# Patient Record
Sex: Female | Born: 1990 | Race: Black or African American | Hispanic: No | Marital: Single | Smoking: Never smoker
Health system: Southern US, Community
[De-identification: ages and names within clinical notes are randomized; demographics above are authoritative.]

## PROBLEM LIST (undated history)

## (undated) DIAGNOSIS — A609 Anogenital herpesviral infection, unspecified: Secondary | ICD-10-CM

---

## 2020-02-08 ENCOUNTER — Other Ambulatory Visit: Payer: Self-pay

## 2020-02-08 ENCOUNTER — Encounter (HOSPITAL_COMMUNITY): Payer: Self-pay | Admitting: Emergency Medicine

## 2020-02-08 ENCOUNTER — Encounter (HOSPITAL_COMMUNITY): Payer: Self-pay

## 2020-02-08 ENCOUNTER — Emergency Department (HOSPITAL_COMMUNITY)
Admission: EM | Admit: 2020-02-08 | Discharge: 2020-02-09 | Disposition: A | Discharge status: Home / Self Care | Payer: Self-pay | Attending: Emergency Medicine | Admitting: Emergency Medicine

## 2020-02-08 ENCOUNTER — Ambulatory Visit (HOSPITAL_COMMUNITY)
Admission: EM | Admit: 2020-02-08 | Discharge: 2020-02-08 | Disposition: A | Discharge status: Home / Self Care | Payer: Self-pay | Attending: Family Medicine | Admitting: Family Medicine

## 2020-02-08 DIAGNOSIS — J039 Acute tonsillitis, unspecified: Secondary | ICD-10-CM | POA: Insufficient documentation

## 2020-02-08 DIAGNOSIS — J36 Peritonsillar abscess: Secondary | ICD-10-CM

## 2020-02-08 HISTORY — DX: Anogenital herpesviral infection, unspecified: A60.9

## 2020-02-08 LAB — BASIC METABOLIC PANEL
Anion gap: 11 (ref 5–15)
BUN: 11 mg/dL (ref 6–20)
CO2: 20 mmol/L — ABNORMAL LOW (ref 22–32)
Calcium: 9.2 mg/dL (ref 8.9–10.3)
Chloride: 105 mmol/L (ref 98–111)
Creatinine, Ser: 0.84 mg/dL (ref 0.44–1.00)
GFR calc Af Amer: >60 mL/min (ref >60)
GFR calc non Af Amer: >60 mL/min (ref >60)
Glucose, Bld: 101 mg/dL — ABNORMAL HIGH (ref 70–99)
Potassium: 2.9 mmol/L — ABNORMAL LOW (ref 3.5–5.1)
Sodium: 136 mmol/L (ref 135–145)

## 2020-02-08 LAB — LACTIC ACID, PLASMA: Lactic Acid, Venous: 1.1 mmol/L (ref 0.5–1.9)

## 2020-02-08 LAB — CBC WITH DIFFERENTIAL/PLATELET
Abs Immature Granulocytes: 0.05 10*3/uL (ref 0.00–0.07)
Basophils Absolute: 0 10*3/uL (ref 0.0–0.1)
Basophils Relative: 0 %
Eosinophils Absolute: 0 10*3/uL (ref 0.0–0.5)
Eosinophils Relative: 0 %
HCT: 36.1 % (ref 36.0–46.0)
Hemoglobin: 11.1 g/dL — ABNORMAL LOW (ref 12.0–15.0)
Immature Granulocytes: 0 %
Lymphocytes Relative: 8 %
Lymphs Abs: 1.2 10*3/uL (ref 0.7–4.0)
MCH: 22.8 pg — ABNORMAL LOW (ref 26.0–34.0)
MCHC: 30.7 g/dL (ref 30.0–36.0)
MCV: 74.1 fL — ABNORMAL LOW (ref 80.0–100.0)
Monocytes Absolute: 1.2 10*3/uL — ABNORMAL HIGH (ref 0.1–1.0)
Monocytes Relative: 8 %
Neutro Abs: 11.8 10*3/uL — ABNORMAL HIGH (ref 1.7–7.7)
Neutrophils Relative %: 84 %
Platelets: 210 10*3/uL (ref 150–400)
RBC: 4.87 MIL/uL (ref 3.87–5.11)
RDW: 15.8 % — ABNORMAL HIGH (ref 11.5–15.5)
WBC: 14.3 10*3/uL — ABNORMAL HIGH (ref 4.0–10.5)
nRBC: 0 % (ref 0.0–0.2)

## 2020-02-08 LAB — GROUP A STREP BY PCR: Group A Strep by PCR: NOT DETECTED

## 2020-02-08 LAB — POCT RAPID STREP A, ED / UC: Streptococcus, Group A Screen (Direct): NEGATIVE

## 2020-02-08 MED ORDER — PREDNISONE 50 MG PO TABS
50.0000 mg | ORAL_TABLET | Freq: Every day | ORAL | 0 refills | Status: AC
Start: 2020-02-08 — End: 2020-02-12

## 2020-02-08 MED ORDER — POTASSIUM CHLORIDE 10 MEQ/100ML IV SOLN
10.0000 meq | INTRAVENOUS | Status: AC
  Administered 2020-02-08 (×2): 10 meq via INTRAVENOUS
  Filled 2020-02-08 (×2): qty 100

## 2020-02-08 MED ORDER — DEXAMETHASONE SODIUM PHOSPHATE 10 MG/ML IJ SOLN
10.0000 mg | Freq: Once | INTRAMUSCULAR | Status: AC
  Administered 2020-02-08: 10 mg via INTRAVENOUS
  Filled 2020-02-08: qty 1

## 2020-02-08 MED ORDER — SODIUM CHLORIDE 0.9 % IV SOLN
2.0000 g | Freq: Once | INTRAVENOUS | Status: AC
  Administered 2020-02-08: 2 g via INTRAVENOUS
  Filled 2020-02-08: qty 20

## 2020-02-08 MED ORDER — CLINDAMYCIN HCL 150 MG PO CAPS
450.0000 mg | ORAL_CAPSULE | Freq: Three times a day (TID) | ORAL | 0 refills | Status: AC
Start: 2020-02-08 — End: 2020-02-15

## 2020-02-08 MED ORDER — SODIUM CHLORIDE 0.9 % IV SOLN: Freq: Once | INTRAVENOUS | Status: AC

## 2020-02-08 NOTE — ED Provider Notes (Signed)
[facility]    CSN: [id number] Arrival date & time: 02/08/20  5361      History   Chief Complaint No chief complaint on file.   HPI Carrie Strickland is a 29 y.o. female.   Patient is a 29 year old female that presents today with sore throat, trouble swallowing, pain with swallowing.  Took ibuprofen this morning had trouble swallowing the ibuprofen.  Reporting even having trouble swallowing liquids.  Low-grade fever here today.  Mild headache and chills.  Remote history of strep throat years ago.     Past Medical History:  Diagnosis Date  . HSV (herpes simplex virus) anogenital infection     There are no problems to display for this patient.   History reviewed. No pertinent surgical history.  OB History   No obstetric history on file.      Home Medications    Prior to Admission medications   Not on File    Family History Family History  Problem Relation Age of Onset  . Healthy Mother   . Hypertension Father     Social History Social History   Tobacco Use  . Smoking status: Never Smoker  . Smokeless tobacco: Never Used  Vaping Use  . Vaping Use: Never used  Substance Use Topics  . Alcohol use: Yes    Comment: Occasional  . Drug use: Yes    Types: Marijuana    Comment: Occasional     Allergies   Patient has no allergy information on record.   Review of Systems Review of Systems   Physical Exam Triage Vital Signs ED Triage Vitals  Enc Vitals Group     BP 02/08/20 0844 123/73     Pulse Rate 02/08/20 0844 84     Resp 02/08/20 0844 18     Temp 02/08/20 0844 99.8 F (37.7 C)     Temp Source 02/08/20 0844 Oral     SpO2 02/08/20 0844 98 %     Weight --      Height --      Head Circumference --      Peak Flow --      Pain Score 02/08/20 0840 8     Pain Loc --      Pain Edu? --      Excl. in GC? --    No data found.  Updated Vital Signs BP 123/73 (BP Location: Left Arm)   Pulse 84   Temp 99.8 F (37.7 C) (Oral)   Resp  18   LMP 01/11/2020   SpO2 98%   Visual Acuity Right Eye Distance:   Left Eye Distance:   Bilateral Distance:    Right Eye Near:   Left Eye Near:    Bilateral Near:     Physical Exam Vitals and nursing note reviewed.  Constitutional:      General: She is not in acute distress.    Appearance: Normal appearance. She is not ill-appearing, toxic-appearing or diaphoretic.  HENT:     Head: Normocephalic.     Nose: Nose normal.     Mouth/Throat:     Pharynx: Uvula midline. Pharyngeal swelling, oropharyngeal exudate and posterior oropharyngeal erythema present.     Tonsils: Tonsillar abscess present. 4+ on the right. 4+ on the left.  Eyes:     Conjunctiva/sclera: Conjunctivae normal.  Pulmonary:     Effort: Pulmonary effort is normal.  Musculoskeletal:        General: Normal range of motion.     Cervical  back: Normal range of motion.  Skin:    General: Skin is warm and dry.     Findings: No rash.  Neurological:     Mental Status: She is alert.  Psychiatric:        Mood and Affect: Mood normal.      UC Treatments / Results  Labs (all labs ordered are listed, but only abnormal results are displayed) Labs Reviewed  CULTURE, GROUP A STREP [facility])  POCT RAPID STREP A, ED / UC    EKG   Radiology No results found.  Procedures Procedures (including critical care time)  Medications Ordered in UC Medications - No data to display  Initial Impression / Assessment and Plan / UC Course  I have reviewed the triage vital signs and the nursing notes.  Pertinent labs & imaging results that were available during my care of the patient were reviewed by me and considered in my medical decision making (see chart for details).     Peritonsillar abscess very large in size.  Patient having trouble opening mouth and unable to swallow medication.  We will send to the ER for IV antibiotics and possible ENT consult. Rapid strep test negative. Final Clinical Impressions(s) / UC  Diagnoses   Final diagnoses:  Abscess, peritonsillar     Discharge Instructions     Please go to the ER for further management of this peritonsillar abscess.     ED Prescriptions    None     PDMP not reviewed this encounter.   Janace Aris, NP 02/08/20 [phone]

## 2020-02-08 NOTE — Discharge Instructions (Addendum)
Please go to the ER for further management of this peritonsillar abscess.

## 2020-02-08 NOTE — ED Provider Notes (Addendum)
[facility] EMERGENCY DEPARTMENT Provider Note   CSN: [id number] Arrival date & time: 02/08/20  0935     History No chief complaint on file.   Carrie Strickland is a 29 y.o. female with no relevant past medical history presents the ED with a 2-day history of progressively worsening sore throat.  Patient went to an urgent care this morning and was subsequently referred here to the ED given concern for PTA.  On my examination, patient states that she has been having fevers and chills at home.  She also endorses voice change and "difficulty talking".  She is also experiencing difficulty swallowing and tolerating secretions.  She cannot open her mouth wide on my examination.  She denies any congestion, cough, headache or dizziness, or other symptoms.    She has not received her COVID-19 vaccination.  HPI     Past Medical History:  Diagnosis Date  . HSV (herpes simplex virus) anogenital infection     There are no problems to display for this patient.   History reviewed. No pertinent surgical history.   OB History   No obstetric history on file.     Family History  Problem Relation Age of Onset  . Healthy Mother   . Hypertension Father     Social History   Tobacco Use  . Smoking status: Never Smoker  . Smokeless tobacco: Never Used  Vaping Use  . Vaping Use: Never used  Substance Use Topics  . Alcohol use: Yes    Comment: Occasional  . Drug use: Yes    Types: Marijuana    Comment: Occasional    Home Medications Prior to Admission medications   Medication Sig Start Date End Date Taking? Authorizing Provider  clindamycin (CLEOCIN) 150 MG capsule Take 3 capsules (450 mg total) by mouth 3 (three) times daily for 7 days. 02/08/20 02/15/20  Lorelee New, PA-C  predniSONE (DELTASONE) 50 MG tablet Take 1 tablet (50 mg total) by mouth daily with breakfast for 4 days. 02/08/20 02/12/20  Lorelee New, PA-C    Allergies    Patient has no known  allergies.  Review of Systems   Review of Systems  All other systems reviewed and are negative.   Physical Exam Updated Vital Signs BP 123/73   Pulse 93   Temp 99.7 F (37.6 C) (Oral)   Resp 18   LMP 01/11/2020   SpO2 100%   Physical Exam Vitals and nursing note reviewed. Exam conducted with a chaperone present.  Constitutional:      General: She is not in acute distress.    Appearance: She is ill-appearing.  HENT:     Head: Normocephalic and atraumatic.     Mouth/Throat:     Comments: Patent oropharynx.  Uvula relatively midline.  Tonsillar hypertrophy with exudates noted bilaterally.  Left tonsil appears to be larger, concern for abscess.  No other masses appreciated.  No tongue swelling or floor of mouth induration.  2 finger trismus.  Tolerating secretions.  Hot potato voice noted. Eyes:     General: No scleral icterus.    Conjunctiva/sclera: Conjunctivae normal.  Neck:     Comments: No meningismus. Cardiovascular:     Rate and Rhythm: Regular rhythm. Tachycardia present.     Pulses: Normal pulses.     Heart sounds: Normal heart sounds.  Pulmonary:     Effort: Pulmonary effort is normal. No respiratory distress.     Breath sounds: Normal breath sounds.     Comments:  No wheezing or stridor appreciated. Musculoskeletal:     Cervical back: Normal range of motion and neck supple. No rigidity.  Skin:    General: Skin is dry.  Neurological:     Mental Status: She is alert.     GCS: GCS eye subscore is 4. GCS verbal subscore is 5. GCS motor subscore is 6.  Psychiatric:        Mood and Affect: Mood normal.        Behavior: Behavior normal.        Thought Content: Thought content normal.     ED Results / Procedures / Treatments   Labs (all labs ordered are listed, but only abnormal results are displayed) Labs Reviewed  CBC WITH DIFFERENTIAL/PLATELET - Abnormal; Notable for the following components:      Result Value   WBC 14.3 (*)    Hemoglobin 11.1 (*)    MCV  74.1 (*)    MCH 22.8 (*)    RDW 15.8 (*)    Neutro Abs 11.8 (*)    Monocytes Absolute 1.2 (*)    All other components within normal limits  BASIC METABOLIC PANEL - Abnormal; Notable for the following components:   Potassium 2.9 (*)    CO2 20 (*)    Glucose, Bld 101 (*)    All other components within normal limits  GROUP A STREP BY PCR  LACTIC ACID, PLASMA  LACTIC ACID, PLASMA    EKG None  Radiology No results found.  Procedures Procedures (including critical care time)  Medications Ordered in ED Medications  potassium chloride 10 mEq in 100 mL IVPB (10 mEq Intravenous New Bag/Given 02/08/20 1517)  dexamethasone (DECADRON) injection 10 mg (10 mg Intravenous Given 02/08/20 1354)  cefTRIAXone (ROCEPHIN) 2 g in sodium chloride 0.9 % 100 mL IVPB (0 g Intravenous Stopped 02/08/20 1427)  0.9 %  sodium chloride infusion ( Intravenous New Bag/Given 02/08/20 1353)    ED Course  I have reviewed the triage vital signs and the nursing notes.  Pertinent labs & imaging results that were available during my care of the patient were reviewed by me and considered in my medical decision making (see chart for details).  Clinical Course as of Feb 08 1619  Thu Feb 08, 2020  1458 Discussed with Dr. Pollyann Kennedy, ENT, who recommends that patient be discharged home on clindamycin with instruction to call the office to schedule appointment for close follow-up.  He is reassured given that she can still tolerate full liquid by mouth and has a patent oropharynx.  Given that her sore throat symptoms just began a couple days ago, this would be an unusually quick progression.     [GG]    Clinical Course User Index [GG] Lorelee New, PA-C   MDM Rules/Calculators/A&P                          Patient presents to the ED with evidence of tonsillitis and exudates noted.  The uvula is relatively midline, however there does appear to be a larger mass on the left side concerning for PTA.    Discussed with Dr.  Pollyann Kennedy, ENT, who recommends that patient be discharged home on clindamycin with instruction to call the office to schedule appointment for close follow-up.  He is reassured given that she can still tolerate full liquid by mouth and has a patent oropharynx.  Given that her sore throat symptoms just began a couple days ago, this would  be an unusually quick progression.  No CT imaging warranted.  He recommends discharge home with course of antibiotics and emphasis on drinking plenty of fluids.  On subsequent evaluation after patient was given Decadron, she is feeling much improved.  Strict ED return precautions discussed with patient.  All of the evaluation and work-up results were discussed with the patient and any family at bedside.  Patient and/or family were informed that while patient is appropriate for discharge at this time, some medical emergencies may only develop or become detectable after a period of time.  I specifically instructed patient and/or family to return to return to the ED or seek immediate medical attention for any new or worsening symptoms.  They were provided opportunity to ask any additional questions and have none at this time.  Prior to discharge patient is feeling well, agreeable with plan for discharge home.  They have expressed understanding of verbal discharge instructions as well as return precautions and are agreeable to the plan.    Final Clinical Impression(s) / ED Diagnoses Final diagnoses:  Tonsillitis    Rx / DC Orders ED Discharge Orders         Ordered    clindamycin (CLEOCIN) 150 MG capsule  3 times daily     Discontinue  Reprint     02/08/20 1615    predniSONE (DELTASONE) 50 MG tablet  Daily with breakfast     Discontinue  Reprint     02/08/20 1615           Lorelee New, PA-C 02/08/20 1626    Lorelee New, PA-C 02/08/20 1626    Margarita Grizzle, MD 02/09/20 1330

## 2020-02-08 NOTE — ED Triage Notes (Signed)
Pt presents with sore throat, chills, headache, ear ache, 1 episode of diarrhea xs 2 days.  Denies N, V, abdominal pain, loss taste and smell.   States took ibuprofen at 4 am, with minimal relief.

## 2020-02-08 NOTE — ED Notes (Signed)
Patient is being discharged from the Urgent Care and sent to the Emergency Department via personal vehicle . Per provider Dahlia Byes, patient is in need of higher level of care due to peritonsillar abscess. Patient is aware and verbalizes understanding of plan of care.   Vitals:   02/08/20 0844  BP: 123/73  Pulse: 84  Resp: 18  Temp: 99.8 F (37.7 C)  SpO2: 98%

## 2020-02-08 NOTE — Discharge Instructions (Signed)
Please take your medications, as directed.  You will need to follow-up with ENT regarding your tonsillitis.  Suspect that this could be viral in etiology, but recommending that you take these antibiotics as directed.  Return to the ED or seek immediate medical attention should you experience any new or worsening symptoms.

## 2020-02-08 NOTE — ED Triage Notes (Signed)
Patient from [facility] to be evaluated for tonsillar abscess, pain x 2 days, no trouble handling secretions

## 2020-02-10 LAB — CULTURE, GROUP A STREP (THRC)

## 2021-08-21 ENCOUNTER — Encounter (HOSPITAL_BASED_OUTPATIENT_CLINIC_OR_DEPARTMENT_OTHER): Payer: Self-pay

## 2021-08-21 ENCOUNTER — Other Ambulatory Visit: Payer: Self-pay

## 2021-08-21 ENCOUNTER — Emergency Department (HOSPITAL_BASED_OUTPATIENT_CLINIC_OR_DEPARTMENT_OTHER): Payer: 59

## 2021-08-21 ENCOUNTER — Emergency Department (HOSPITAL_BASED_OUTPATIENT_CLINIC_OR_DEPARTMENT_OTHER)
Admission: EM | Admit: 2021-08-21 | Discharge: 2021-08-21 | Disposition: A | Discharge status: Home / Self Care | Payer: 59 | Attending: Emergency Medicine | Admitting: Emergency Medicine

## 2021-08-21 DIAGNOSIS — S29001A Unspecified injury of muscle and tendon of front wall of thorax, initial encounter: Secondary | ICD-10-CM | POA: Insufficient documentation

## 2021-08-21 DIAGNOSIS — S301XXA Contusion of abdominal wall, initial encounter: Secondary | ICD-10-CM | POA: Insufficient documentation

## 2021-08-21 DIAGNOSIS — Y92410 Unspecified street and highway as the place of occurrence of the external cause: Secondary | ICD-10-CM | POA: Diagnosis not present

## 2021-08-21 DIAGNOSIS — S40022A Contusion of left upper arm, initial encounter: Secondary | ICD-10-CM | POA: Diagnosis not present

## 2021-08-21 DIAGNOSIS — S0990XA Unspecified injury of head, initial encounter: Secondary | ICD-10-CM | POA: Insufficient documentation

## 2021-08-21 DIAGNOSIS — S199XXA Unspecified injury of neck, initial encounter: Secondary | ICD-10-CM | POA: Diagnosis not present

## 2021-08-21 DIAGNOSIS — S299XXA Unspecified injury of thorax, initial encounter: Secondary | ICD-10-CM | POA: Insufficient documentation

## 2021-08-21 DIAGNOSIS — S3991XA Unspecified injury of abdomen, initial encounter: Secondary | ICD-10-CM | POA: Diagnosis present

## 2021-08-21 LAB — CBC WITH DIFFERENTIAL/PLATELET
Abs Immature Granulocytes: 0.01 10*3/uL (ref 0.00–0.07)
Basophils Absolute: 0 10*3/uL (ref 0.0–0.1)
Basophils Relative: 0 %
Eosinophils Absolute: 0.2 10*3/uL (ref 0.0–0.5)
Eosinophils Relative: 3 %
HCT: 36.2 % (ref 36.0–46.0)
Hemoglobin: 11.5 g/dL — ABNORMAL LOW (ref 12.0–15.0)
Immature Granulocytes: 0 %
Lymphocytes Relative: 34 %
Lymphs Abs: 1.9 10*3/uL (ref 0.7–4.0)
MCH: 24.5 pg — ABNORMAL LOW (ref 26.0–34.0)
MCHC: 31.8 g/dL (ref 30.0–36.0)
MCV: 77 fL — ABNORMAL LOW (ref 80.0–100.0)
Monocytes Absolute: 0.7 10*3/uL (ref 0.1–1.0)
Monocytes Relative: 12 %
Neutro Abs: 2.9 10*3/uL (ref 1.7–7.7)
Neutrophils Relative %: 51 %
Platelets: 214 10*3/uL (ref 150–400)
RBC: 4.7 MIL/uL (ref 3.87–5.11)
RDW: 15 % (ref 11.5–15.5)
WBC: 5.7 10*3/uL (ref 4.0–10.5)
nRBC: 0 % (ref 0.0–0.2)

## 2021-08-21 LAB — PREGNANCY, URINE: Preg Test, Ur: NEGATIVE

## 2021-08-21 LAB — COMPREHENSIVE METABOLIC PANEL
ALT: 17 U/L (ref 0–44)
AST: 20 U/L (ref 15–41)
Albumin: 4.1 g/dL (ref 3.5–5.0)
Alkaline Phosphatase: 39 U/L (ref 38–126)
Anion gap: 8 (ref 5–15)
BUN: 12 mg/dL (ref 6–20)
CO2: 21 mmol/L — ABNORMAL LOW (ref 22–32)
Calcium: 9 mg/dL (ref 8.9–10.3)
Chloride: 106 mmol/L (ref 98–111)
Creatinine, Ser: 0.77 mg/dL (ref 0.44–1.00)
GFR, Estimated: >60 mL/min (ref >60)
Glucose, Bld: 97 mg/dL (ref 70–99)
Potassium: 3.8 mmol/L (ref 3.5–5.1)
Sodium: 135 mmol/L (ref 135–145)
Total Bilirubin: 0.7 mg/dL (ref 0.3–1.2)
Total Protein: 7.4 g/dL (ref 6.5–8.1)

## 2021-08-21 MED ORDER — IBUPROFEN 600 MG PO TABS: 600.0000 mg | ORAL_TABLET | Freq: Four times a day (QID) | ORAL | 0 refills | Status: AC | PRN

## 2021-08-21 MED ORDER — KETOROLAC TROMETHAMINE 30 MG/ML IJ SOLN
30.0000 mg | Freq: Once | INTRAMUSCULAR | Status: AC
  Administered 2021-08-21: 30 mg via INTRAVENOUS
  Filled 2021-08-21: qty 1

## 2021-08-21 MED ORDER — METHOCARBAMOL 500 MG PO TABS: 500.0000 mg | ORAL_TABLET | Freq: Two times a day (BID) | ORAL | 0 refills | Status: AC

## 2021-08-21 MED ORDER — IOHEXOL 300 MG/ML  SOLN
100.0000 mL | Freq: Once | INTRAMUSCULAR | Status: AC | PRN
  Administered 2021-08-21: 100 mL via INTRAVENOUS

## 2021-08-21 NOTE — Discharge Instructions (Addendum)
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
## 2021-08-21 NOTE — ED Notes (Signed)
No acute distress noted upon this RN's departure of patient. Verified discharge paperwork with name and DOB. Vital signs stable. Patient taken to checkout window. Discharge paperwork discussed with patient. No further questions voiced upon discharge.  ° °

## 2021-08-29 ENCOUNTER — Encounter (HOSPITAL_BASED_OUTPATIENT_CLINIC_OR_DEPARTMENT_OTHER): Payer: Self-pay | Admitting: Emergency Medicine

## 2021-08-29 ENCOUNTER — Emergency Department (HOSPITAL_BASED_OUTPATIENT_CLINIC_OR_DEPARTMENT_OTHER)
Admission: EM | Admit: 2021-08-29 | Discharge: 2021-08-29 | Disposition: A | Discharge status: Home / Self Care | Payer: 59 | Attending: Emergency Medicine | Admitting: Emergency Medicine

## 2021-08-29 ENCOUNTER — Other Ambulatory Visit: Payer: Self-pay

## 2021-08-29 DIAGNOSIS — R0789 Other chest pain: Secondary | ICD-10-CM | POA: Diagnosis not present

## 2021-08-29 DIAGNOSIS — M25512 Pain in left shoulder: Secondary | ICD-10-CM | POA: Insufficient documentation

## 2021-08-29 MED ORDER — LIDOCAINE 5 % EX PTCH
2.0000 | MEDICATED_PATCH | CUTANEOUS | Status: DC
  Administered 2021-08-29: 2 via TRANSDERMAL
  Filled 2021-08-29: qty 2

## 2021-08-29 MED ORDER — ACETAMINOPHEN 500 MG PO TABS
1000.0000 mg | ORAL_TABLET | Freq: Once | ORAL | Status: AC
  Administered 2021-08-29: 1000 mg via ORAL
  Filled 2021-08-29: qty 2

## 2021-08-29 MED ORDER — LIDOCAINE 5 % EX PTCH: 1.0000 | MEDICATED_PATCH | CUTANEOUS | 0 refills | Status: AC

## 2021-08-29 MED ORDER — IBUPROFEN 800 MG PO TABS
800.0000 mg | ORAL_TABLET | Freq: Once | ORAL | Status: AC
  Administered 2021-08-29: 800 mg via ORAL
  Filled 2021-08-29: qty 1
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
## 2024-07-13 ENCOUNTER — Emergency Department (HOSPITAL_COMMUNITY)
Admission: EM | Admit: 2024-07-13 | Discharge: 2024-07-13 | Discharge status: Against Medical Advice | Payer: Self-pay | Attending: Emergency Medicine | Admitting: Emergency Medicine

## 2024-07-13 DIAGNOSIS — R112 Nausea with vomiting, unspecified: Secondary | ICD-10-CM | POA: Diagnosis not present

## 2024-07-13 DIAGNOSIS — K0889 Other specified disorders of teeth and supporting structures: Secondary | ICD-10-CM | POA: Diagnosis not present

## 2024-07-13 DIAGNOSIS — R197 Diarrhea, unspecified: Secondary | ICD-10-CM | POA: Diagnosis not present

## 2024-07-13 DIAGNOSIS — Z5321 Procedure and treatment not carried out due to patient leaving prior to being seen by health care provider: Secondary | ICD-10-CM | POA: Insufficient documentation

## 2024-07-13 DIAGNOSIS — R1084 Generalized abdominal pain: Secondary | ICD-10-CM | POA: Diagnosis present

## 2024-07-13 LAB — COMPREHENSIVE METABOLIC PANEL WITH GFR
ALT: 37 U/L (ref 0–44)
AST: 36 U/L (ref 15–41)
Albumin: 4.1 g/dL (ref 3.5–5.0)
Alkaline Phosphatase: 48 U/L (ref 38–126)
Anion gap: 10 (ref 5–15)
BUN: 12 mg/dL (ref 6–20)
CO2: 22 mmol/L (ref 22–32)
Calcium: 9.2 mg/dL (ref 8.9–10.3)
Chloride: 110 mmol/L (ref 98–111)
Creatinine, Ser: 0.81 mg/dL (ref 0.44–1.00)
GFR, Estimated: >60 mL/min
Glucose, Bld: 105 mg/dL — ABNORMAL HIGH (ref 70–99)
Potassium: 4 mmol/L (ref 3.5–5.1)
Sodium: 142 mmol/L (ref 135–145)
Total Bilirubin: 0.3 mg/dL (ref 0.0–1.2)
Total Protein: 6.9 g/dL (ref 6.5–8.1)

## 2024-07-13 LAB — LIPASE, BLOOD: Lipase: 16 U/L (ref 11–51)

## 2024-07-13 LAB — CBC
HCT: 33.5 % — ABNORMAL LOW (ref 36.0–46.0)
Hemoglobin: 10.7 g/dL — ABNORMAL LOW (ref 12.0–15.0)
MCH: 25.5 pg — ABNORMAL LOW (ref 26.0–34.0)
MCHC: 31.9 g/dL (ref 30.0–36.0)
MCV: 79.8 fL — ABNORMAL LOW (ref 80.0–100.0)
Platelets: 195 K/uL (ref 150–400)
RBC: 4.2 MIL/uL (ref 3.87–5.11)
RDW: 15.3 % (ref 11.5–15.5)
WBC: 7.1 K/uL (ref 4.0–10.5)
nRBC: 0 % (ref 0.0–0.2)

## 2024-07-13 LAB — HCG, SERUM, QUALITATIVE: Preg, Serum: NEGATIVE

## 2024-07-13 NOTE — ED Provider Triage Note (Signed)
 Emergency Medicine Provider Triage Evaluation Note  Carrie Strickland , a 34 y.o. female  was evaluated in triage.  Pt complains of abdominal discomfort.  Patient reports that she took a tablet of doxycycline that was from her sister for concerns of a dental infection.  She states that she has never had a reaction to any antibiotic.  She believes she may have reacted as she took this on an empty stomach.  Has had about 6 episodes of vomiting since earlier today.  Review of Systems  Positive: As above Negative: As above  Physical Exam  BP 125/75 (BP Location: Right Arm)   Pulse (!) 59   Temp 97.8 F (36.6 C) (Oral)   Resp 16   LMP  (LMP Unknown)   SpO2 100%  Gen:   Awake, no distress   Resp:  Normal effort  MSK:   Moves extremities without difficulty Other:    Medical Decision Making  Medically screening exam initiated at 1:47 PM.  Appropriate orders placed.  Carrie Strickland was informed that the remainder of the evaluation will be completed by another provider, this initial triage assessment does not replace that evaluation, and the importance of remaining in the ED until their evaluation is complete.     Aubrina Nieman A, PA-C 07/13/24 1348

## 2024-07-13 NOTE — ED Triage Notes (Addendum)
"   Pt took doxycyline this morning for toothache that was prescribed to her sister, after Pt c/o generalized abd pain ,n/v/d x 1030 AM, pt took med on empty stomach, pt denies UTI s.s or flank pain, pain on palpation or rebound tenderness, denies GI hx.  "

## 2024-07-13 NOTE — ED Notes (Signed)
 Pt had Iv access but it was not documented. Pt came to [facility] and stated that she wanted her IV taken out so she could leave. Audree Baseman, EMT-P removed the pt's IV. Pt then exited the ER unassisted.
